# Patient Record
Sex: Male | Born: 1995 | Race: Black or African American | Hispanic: No | Marital: Single | Smoking: Never smoker
Health system: Southern US, Community
[De-identification: ages and names within clinical notes are randomized; demographics above are authoritative.]

---

## 2019-04-01 ENCOUNTER — Emergency Department (HOSPITAL_COMMUNITY)
Admission: EM | Admit: 2019-04-01 | Discharge: 2019-04-02 | Disposition: A | Discharge status: Home / Self Care | Payer: Self-pay | Attending: Emergency Medicine | Admitting: Emergency Medicine

## 2019-04-01 ENCOUNTER — Other Ambulatory Visit: Payer: Self-pay

## 2019-04-01 ENCOUNTER — Encounter (HOSPITAL_COMMUNITY): Payer: Self-pay | Admitting: Emergency Medicine

## 2019-04-01 ENCOUNTER — Emergency Department (HOSPITAL_COMMUNITY): Payer: Self-pay

## 2019-04-01 DIAGNOSIS — Y9389 Activity, other specified: Secondary | ICD-10-CM | POA: Insufficient documentation

## 2019-04-01 DIAGNOSIS — S43004A Unspecified dislocation of right shoulder joint, initial encounter: Secondary | ICD-10-CM

## 2019-04-01 DIAGNOSIS — Y999 Unspecified external cause status: Secondary | ICD-10-CM | POA: Insufficient documentation

## 2019-04-01 DIAGNOSIS — Y929 Unspecified place or not applicable: Secondary | ICD-10-CM | POA: Insufficient documentation

## 2019-04-01 DIAGNOSIS — S43034A Inferior dislocation of right humerus, initial encounter: Secondary | ICD-10-CM | POA: Insufficient documentation

## 2019-04-01 DIAGNOSIS — X509XXA Other and unspecified overexertion or strenuous movements or postures, initial encounter: Secondary | ICD-10-CM | POA: Insufficient documentation

## 2019-04-01 NOTE — ED Triage Notes (Signed)
Patient here from home with complaints of right shoulder pain after injury. "I think its dislocated".

## 2019-04-02 ENCOUNTER — Emergency Department (HOSPITAL_COMMUNITY): Payer: Self-pay

## 2019-04-02 MED ORDER — LIDOCAINE HCL 2 % IJ SOLN
20.0000 mL | Freq: Once | INTRAMUSCULAR | Status: AC
  Administered 2019-04-02: 01:00:00 400 mg
  Filled 2019-04-02: qty 20

## 2019-04-02 MED ORDER — ONDANSETRON HCL 4 MG/2ML IJ SOLN
4.0000 mg | Freq: Once | INTRAMUSCULAR | Status: AC
  Administered 2019-04-02: 4 mg via INTRAVENOUS
  Filled 2019-04-02: qty 2

## 2019-04-02 MED ORDER — MORPHINE SULFATE (PF) 4 MG/ML IV SOLN
4.0000 mg | Freq: Once | INTRAVENOUS | Status: AC
  Administered 2019-04-02: 01:00:00 4 mg via INTRAVENOUS
  Filled 2019-04-02: qty 1

## 2019-04-02 NOTE — Discharge Instructions (Addendum)
You were seen today for shoulder dislocation.  This was relocated spontaneously after anesthesia was achieved.  Your repeat x-rays show it has been relocated.  Wear immobilizer for the next several days.  You are at increased risk for redislocation.  Follow-up with orthopedics as needed.

## 2019-04-02 NOTE — ED Provider Notes (Signed)
[facility] DEPT Provider Note   CSN: [id number] Arrival date & time: 04/01/19  2325     History   Chief Complaint Chief Complaint  Patient presents with  . Shoulder Injury    HPI Chanson Teems is a 23 y.o. male.     HPI  This is a 23 year old male who presents with right shoulder pain.  Patient reports he developed right shoulder pain" I think it is dislocated" while having sex prior to presentation.  This happened approximately 30 minutes prior to presentation.  He is reporting some tingling down the arm.  He is right-handed.  He denies any weakness.  No history of shoulder dislocations.  Rating his pain at 10 out of 10.  History reviewed. No pertinent past medical history.  There are no active problems to display for this patient.   History reviewed. No pertinent surgical history.      Home Medications    Prior to Admission medications   Not on File    Family History No family history on file.  Social History Social History   Tobacco Use  . Smoking status: Never Smoker  . Smokeless tobacco: Never Used  Substance Use Topics  . Alcohol use: Never    Frequency: Never  . Drug use: Never     Allergies   Patient has no known allergies.   Review of Systems Review of Systems  Musculoskeletal:       Right shoulder pain  Neurological: Positive for numbness.  All other systems reviewed and are negative.    Physical Exam Updated Vital Signs BP 120/75 (BP Location: Left Arm)   Pulse (!) 125   Temp 98 F (36.7 C) (Oral)   Resp (!) 22   SpO2 100%   Physical Exam Vitals signs and nursing note reviewed.  Constitutional:      Appearance: He is well-developed. He is not ill-appearing.  HENT:     Head: Normocephalic and atraumatic.  Neck:     Musculoskeletal: Neck supple.  Cardiovascular:     Rate and Rhythm: Regular rhythm. Tachycardia present.  Pulmonary:     Effort: Pulmonary effort is normal. No respiratory  distress.  Musculoskeletal:     Comments: Physical exam with anterior and inferior dislocation of the humeral head related to the glenohumeral joint, decreased range of motion, 2+ radial pulse, neurovascular intact distally  Skin:    General: Skin is warm and dry.  Neurological:     Mental Status: He is alert and oriented to person, place, and time.  Psychiatric:        Mood and Affect: Mood normal.      ED Treatments / Results  Labs (all labs ordered are listed, but only abnormal results are displayed) Labs Reviewed - No data to display  EKG None  Radiology Dg Shoulder Right  Result Date: 04/01/2019 CLINICAL DATA:  Shoulder injury EXAM: RIGHT SHOULDER - 2+ VIEW COMPARISON:  None. FINDINGS: AC joint is intact. Anterior inferior dislocation of the right humeral head with respect to the glenoid fossa. No obvious fracture IMPRESSION: Anterior, inferior dislocation of right humeral head with respect to glenoid fossa Electronically Signed   By: Donavan Foil M.D.   On: 04/01/2019 23:52    Procedures .Nerve Block  Date/Time: 04/02/2019 12:58 AM Performed by: Merryl Hacker, MD Authorized by: Merryl Hacker, MD   Consent:    Consent obtained:  Verbal   Consent given by:  Patient   Risks discussed:  Allergic  reaction, infection and nerve damage   Alternatives discussed:  No treatment Indications:    Indications:  Pain relief Location:    Body area:  Upper extremity   Upper extremity nerve blocked: Intra-articular right shoulder.   Laterality:  Right Pre-procedure details:    Skin preparation:  Alcohol   Preparation: Patient was prepped and draped in usual sterile fashion   Skin anesthesia (see MAR for exact dosages):    Skin anesthesia method:  Local infiltration   Local anesthetic:  Lidocaine 2% w/o epi Procedure details (see MAR for exact dosages):    Block needle gauge:  25 G   Anesthetic injected:  Lidocaine 2% w/o epi   Steroid injected:  None   Additive  injected:  None   Injection procedure:  Anatomic landmarks identified   Paresthesia:  Immediately resolved Post-procedure details:    Dressing:  None   Outcome:  Anesthesia achieved   Patient tolerance of procedure:  Tolerated well, no immediate complications   (including critical care time)  Medications Ordered in ED Medications  morphine 4 MG/ML injection 4 mg (4 mg Intravenous Given 04/02/19 0039)  ondansetron (ZOFRAN) injection 4 mg (4 mg Intravenous Given 04/02/19 0038)  lidocaine (XYLOCAINE) 2 % (with pres) injection 400 mg (400 mg Infiltration Given by Other 04/02/19 0046)     Initial Impression / Assessment and Plan / ED Course  I have reviewed the triage vital signs and the nursing notes.  Pertinent labs & imaging results that were available during my care of the patient were reviewed by me and considered in my medical decision making (see chart for details).        Patient presents with dislocation of the right shoulder.  He is neurovascularly intact on exam.  X-ray confirms dislocation.  Patient was injected with intra-articular lidocaine and given morphine in preparation for reduction.  However, on reevaluation to reduce the shoulder, it appears that the shoulder has reached located spontaneously.  Patient states "I thought I felt ago when."  He has better range of motion and significantly improved pain.  Will obtain an x-ray to confirm.  1:11 AM I have reviewed postreduction x-ray.  Shoulder appears relocated.  Will place in a sling immobilizer.  Orthopedic follow-up was provided.  After history, exam, and medical workup I feel the patient has been appropriately medically screened and is safe for discharge home. Pertinent diagnoses were discussed with the patient. Patient was given return precautions.   Final Clinical Impressions(s) / ED Diagnoses   Final diagnoses:  Dislocation of right shoulder joint, initial encounter    ED Discharge Orders    None        Danajah Birdsell, Mayer Masker, MD 04/02/19 0111

## 2020-04-03 IMAGING — CR DG SHOULDER 2+V*R*
2 series · 2 of 2 positions shown · non-contrast
Comparison: None.

CLINICAL DATA: Shoulder injury

EXAM:
RIGHT SHOULDER - 2+ VIEW

[w shoulder external right]
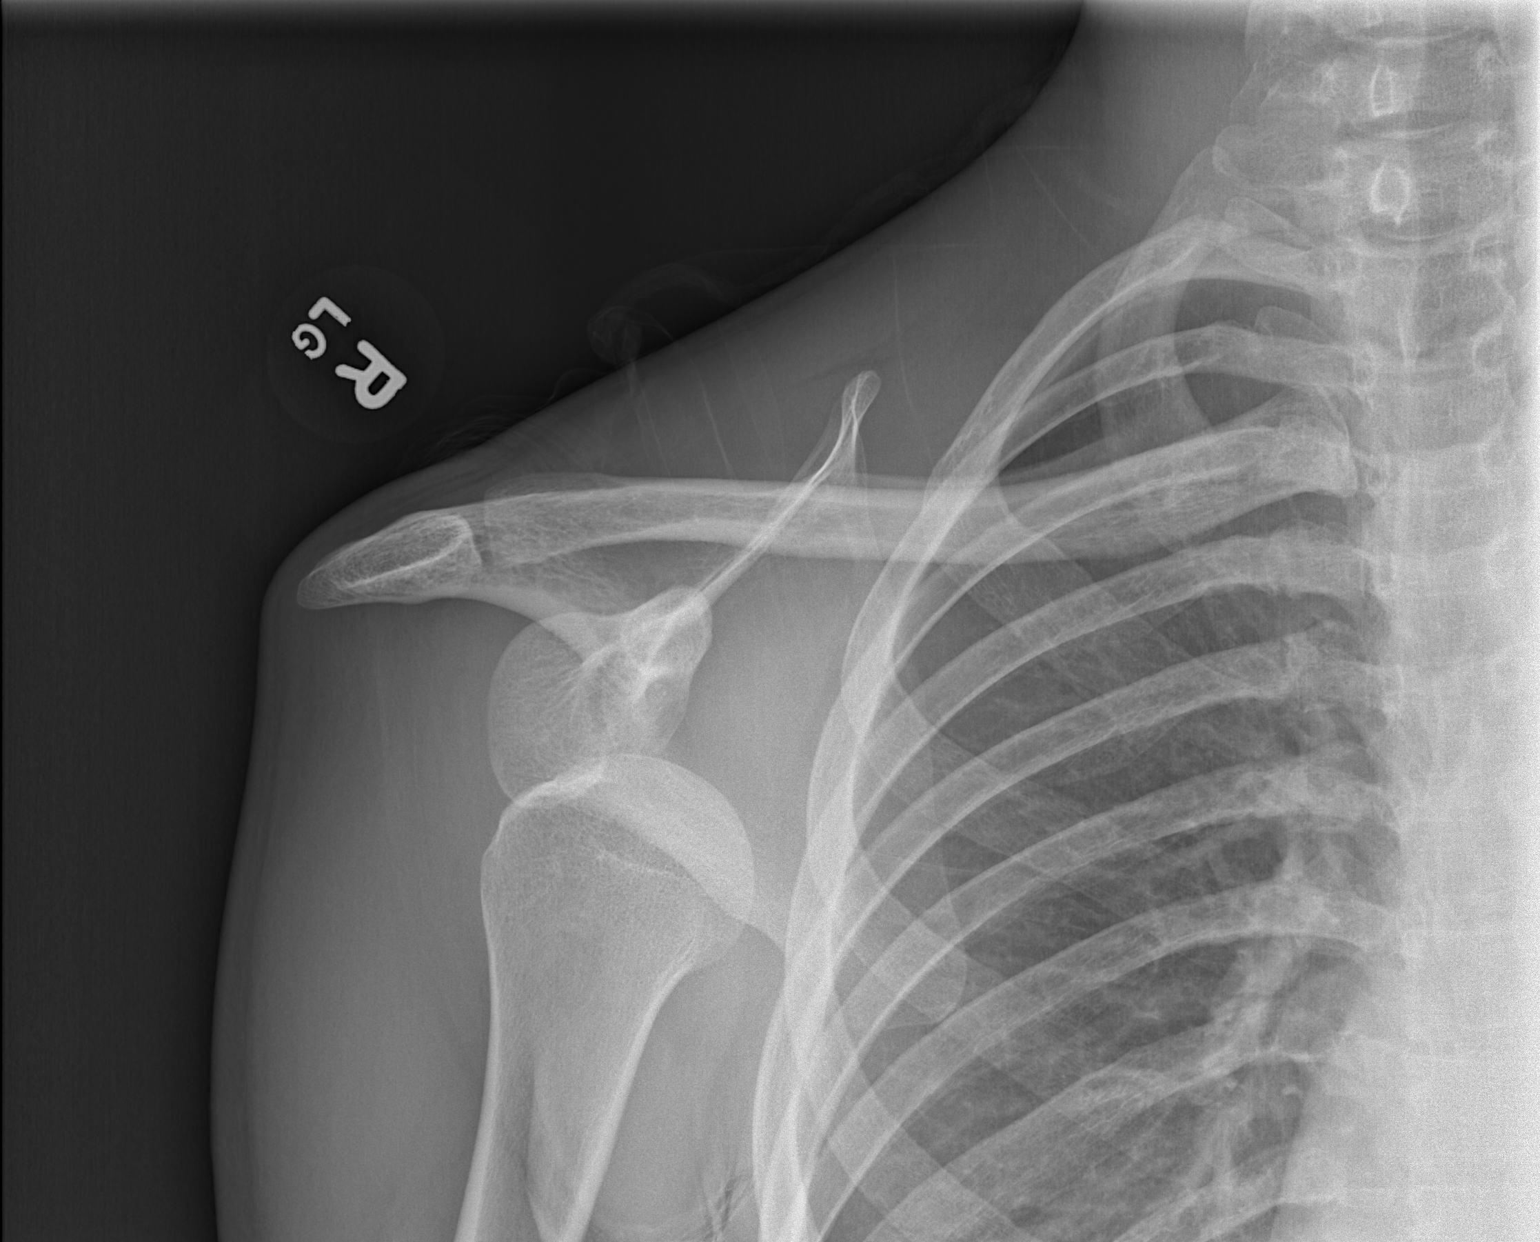

[w shoulder y-view right]
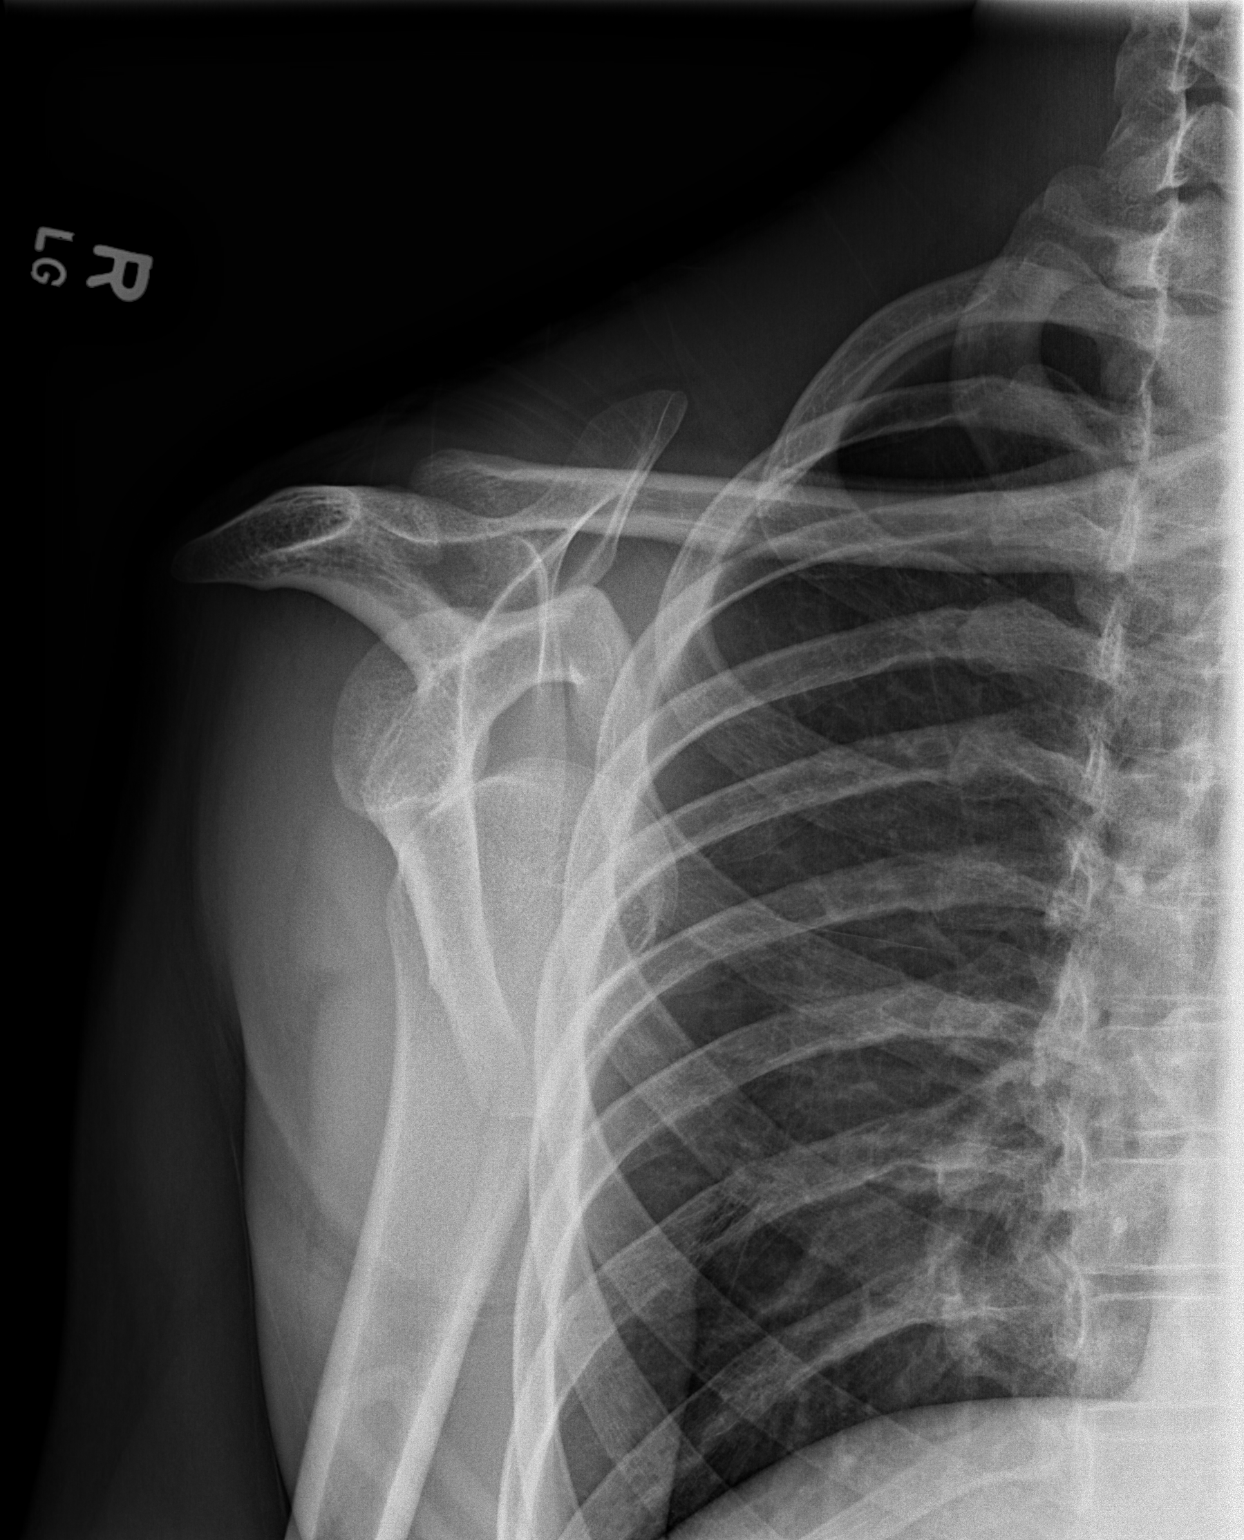

[2 of 2 positions shown; findings below may reference images not displayed]

FINDINGS: AC joint is intact. Anterior inferior dislocation of the right
humeral head with respect to the glenoid fossa. No obvious fracture
IMPRESSION: Anterior, inferior dislocation of right humeral head with respect to
glenoid fossa

## 2020-04-04 IMAGING — DX DG SHOULDER 2+V PORT*R*
2 series · 2 of 2 positions shown · non-contrast
Comparison: Pre reduction radiograph earlier this day.

CLINICAL DATA: Dislocation, postreduction.

EXAM:
PORTABLE RIGHT SHOULDER

[abdomen kub (1 of 2)]
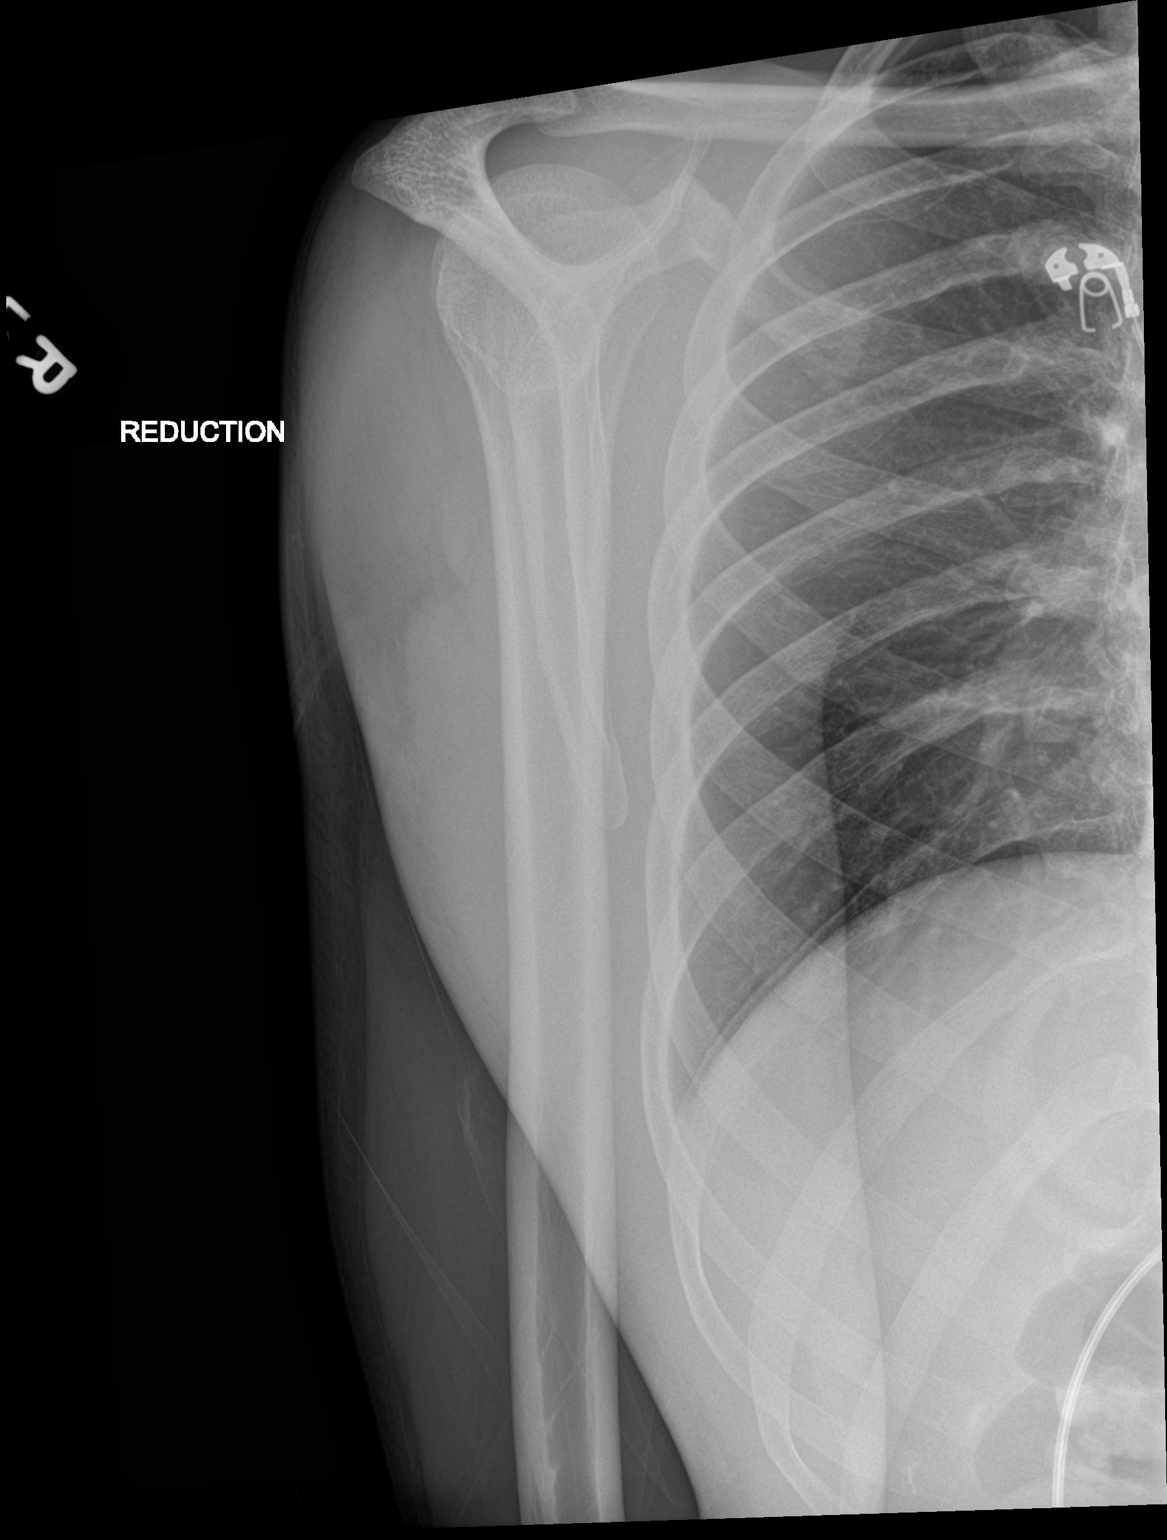

[abdomen kub (2 of 2)]
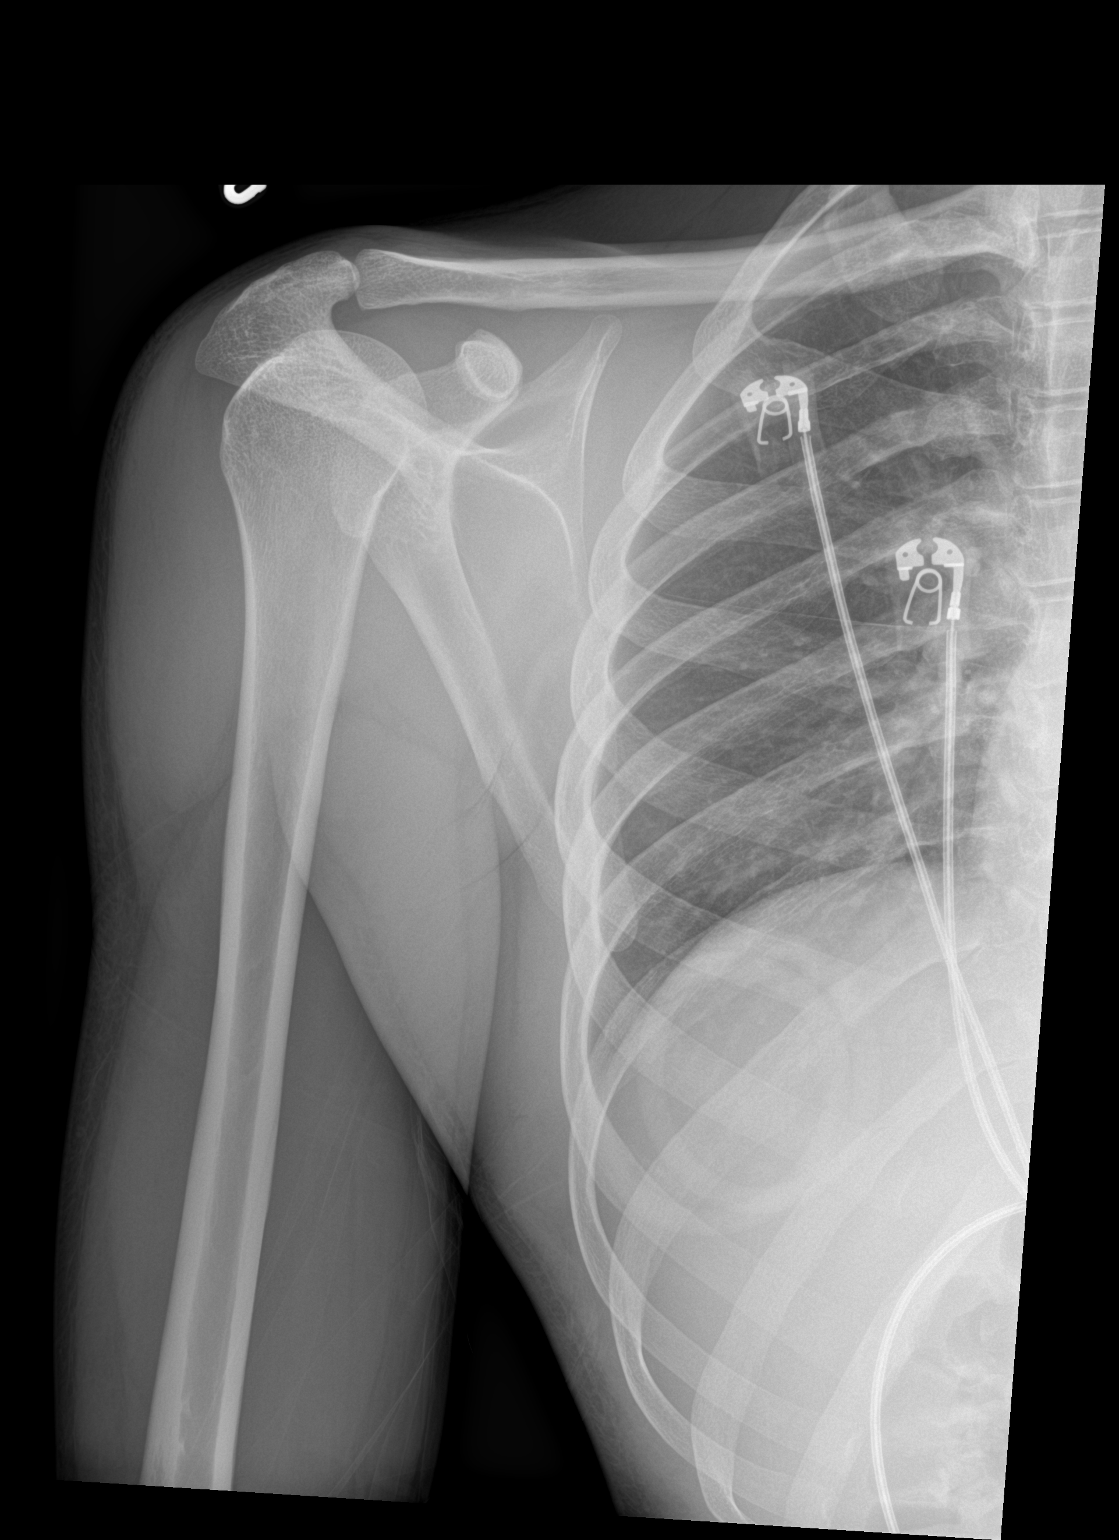

[2 of 2 positions shown; findings below may reference images not displayed]

FINDINGS: Normal glenohumeral alignment postreduction. Possible but not
definite Hill-Sachs impaction injury to the lateral humeral head. No
evidence of bony Bankart. Acromioclavicular joint remains congruent.
IMPRESSION: Normal glenohumeral alignment postreduction. Possible but not
definite Hill-Sachs impaction injury to the lateral humeral head.
